# Patient Record
Sex: Male | Born: 1970 | Race: White | Hispanic: No | Marital: Single | Smoking: Former smoker
Health system: Southern US, Community
[De-identification: ages and names within clinical notes are randomized; demographics above are authoritative.]

## PROBLEM LIST (undated history)

## (undated) DIAGNOSIS — G8929 Other chronic pain: Secondary | ICD-10-CM

## (undated) DIAGNOSIS — Z981 Arthrodesis status: Secondary | ICD-10-CM

## (undated) HISTORY — PX: BACK SURGERY: SHX140

---

## 2007-09-17 ENCOUNTER — Emergency Department (HOSPITAL_COMMUNITY): Admission: EM | Admit: 2007-09-17 | Discharge: 2007-09-17 | Payer: Self-pay | Admitting: Emergency Medicine

## 2011-05-08 ENCOUNTER — Encounter (HOSPITAL_COMMUNITY)
Admission: RE | Admit: 2011-05-08 | Discharge: 2011-05-08 | Disposition: A | Discharge status: Home / Self Care | Payer: 59 | Source: Ambulatory Visit | Attending: Neurosurgery | Admitting: Neurosurgery

## 2011-05-08 LAB — SURGICAL PCR SCREEN
MRSA, PCR: NEGATIVE
Staphylococcus aureus: NEGATIVE

## 2011-05-08 LAB — CBC
HCT: 42.1 % (ref 39.0–52.0)
Hemoglobin: 14.6 g/dL (ref 13.0–17.0)
MCH: 30.6 pg (ref 26.0–34.0)
RBC: 4.77 MIL/uL (ref 4.22–5.81)

## 2011-05-08 LAB — DIFFERENTIAL
Basophils Relative: 0 % (ref 0–1)
Lymphocytes Relative: 29 % (ref 12–46)
Monocytes Relative: 12 % (ref 3–12)
Neutro Abs: 4.1 10*3/uL (ref 1.7–7.7)
Neutrophils Relative %: 58 % (ref 43–77)

## 2011-05-09 ENCOUNTER — Other Ambulatory Visit (HOSPITAL_COMMUNITY): Payer: 59

## 2011-05-11 ENCOUNTER — Inpatient Hospital Stay (HOSPITAL_COMMUNITY)
Admission: RE | Admit: 2011-05-11 | Discharge: 2011-05-16 | DRG: 472 | Disposition: A | Discharge status: Home / Self Care | Payer: 59 | Source: Ambulatory Visit | Attending: Neurosurgery | Admitting: Neurosurgery

## 2011-05-11 ENCOUNTER — Inpatient Hospital Stay (HOSPITAL_COMMUNITY): Payer: 59

## 2011-05-11 DIAGNOSIS — M4712 Other spondylosis with myelopathy, cervical region: Principal | ICD-10-CM | POA: Diagnosis present

## 2011-05-11 DIAGNOSIS — M5000 Cervical disc disorder with myelopathy, unspecified cervical region: Secondary | ICD-10-CM | POA: Diagnosis present

## 2011-05-11 DIAGNOSIS — Z01812 Encounter for preprocedural laboratory examination: Secondary | ICD-10-CM

## 2011-05-11 DIAGNOSIS — K219 Gastro-esophageal reflux disease without esophagitis: Secondary | ICD-10-CM | POA: Diagnosis present

## 2011-05-11 HISTORY — DX: Arthrodesis status: Z98.1

## 2011-05-14 ENCOUNTER — Inpatient Hospital Stay (HOSPITAL_COMMUNITY): Payer: 59

## 2011-05-14 ENCOUNTER — Encounter (HOSPITAL_COMMUNITY): Payer: Self-pay

## 2011-05-23 ENCOUNTER — Emergency Department (HOSPITAL_COMMUNITY): Payer: 59

## 2011-05-23 ENCOUNTER — Emergency Department (HOSPITAL_COMMUNITY)
Admission: EM | Admit: 2011-05-23 | Discharge: 2011-05-23 | Disposition: A | Discharge status: Home / Self Care | Payer: 59 | Attending: Emergency Medicine | Admitting: Emergency Medicine

## 2011-05-23 DIAGNOSIS — M542 Cervicalgia: Secondary | ICD-10-CM | POA: Insufficient documentation

## 2011-05-23 DIAGNOSIS — M5412 Radiculopathy, cervical region: Secondary | ICD-10-CM | POA: Insufficient documentation

## 2011-05-23 DIAGNOSIS — M25519 Pain in unspecified shoulder: Secondary | ICD-10-CM | POA: Insufficient documentation

## 2011-05-23 NOTE — Op Note (Addendum)
NAMEDAREAN, ROTE NO.:  [id number]  MEDICAL RECORD NO.:  [id number]  LOCATION:  SDS                          FACILITY:  [facility]  PHYSICIAN:  Hilda Lias, M.D.   DATE OF BIRTH:  1971-06-10  DATE OF PROCEDURE: DATE OF DISCHARGE:  05/08/2011                              OPERATIVE REPORT   PREOPERATIVE DIAGNOSES:  C3-4, C4-5, C5-6, C6-7 cervical stenosis, radiculopathy.  Early myelopathy.  POSTOPERATIVE DIAGNOSIS:  C3-4, C4-5, C5-6, C6-7 cervical stenosis, radiculopathy.  Early myelopathy.  PROCEDURE:  C3-4, C4-5, C5-6, C6-7 diskectomy, spinal cord decompression, foraminotomy, interbody fusion with cages plate, microscope.  SURGEON:  Hilda Lias, MD.  ASSISTANT:  Clydene Fake, M.D.  CLINICAL HISTORY:  The patient was seen in my office for neck pain with radiation to both upper extremities.  The patient has weakness in the biceps and _both_________ deltoids.  MRI showed cervical stenosis from C3 down to C6-C7.  He has failed conservative treatment.  His stenosis goes from 5 to 9 mm anterior-posterior.  Surgery was advised.  The risk was fully explained to him.  PROCEDURE:  The patient was taken to the OR.  After intubation, the neck was prepped with DuraPrep.  Drapes were applied.  Transverse incision through the skin to subcutaneous tissue was done and retraction was carried out until we were able to find the cervical spine.  There were large osteophytes and the first x-ray showed that indeed were at the level of 4-5.  From there, we started going up to the level of C3 and C4 where she has quite a bit of degenerative disk disease.  Total diskectomy was achieved.  This was done with the microscope.  Then, decompression of the spinal cord after we opened the ligament with bilateral foraminotomy was accomplished.  The endplate was trimmed.  At this level, we have set the cages of 6 mm lordotic  with autograft and DBX inside.  At the level of C4-5,  we found mostly spondylosis without ___hnp_______ canal.  Diskectomy was achieved.  The C5 nerve root was quite thick and swollen.  At this level, a cage of 7 mm _cage_________ was inserted.  At C5-6 and C6-7, the patient has a lot of osteophytes. __degenerative________  disk space.  At the level of C5-6, we removed the disk and indeed we found that severe__________ stenosis.  Decompression of spinal cord as well as 6-7 nerve root was achieved.  At this level, the cage of 9 mm was inserted with autograft inside.  The same findings were visualized at the level of C6-7, we found quite a bit of spondylosis.  At this level, a cage of 6 mm was inserted.  Then we tried different plates and at the end we chose a plate which went from C3 down to C7.  Ten screws were used.  Lateral cervical spine showed good position of the plates and screws.  We were able to see C3-4, C4-5, C5-6, and C6-7 well visualized.  From then on the area was irrigated.  A large drain was left in the precervical area and the wound was closed with Vicryl and Steri-Strips.          ______________________________ Lynne Logan  Jeral Fruit, M.D.     EB/MEDQ  D:  05/11/2011  T:  05/11/2011  Job:  [id number]  Electronically Signed by Hilda Lias M.D. on 06/15/2011 06:15:01 PM

## 2011-05-23 NOTE — Discharge Summary (Signed)
  NAMESPYRIDON, HORNSTEIN NO.:  [id number]  MEDICAL RECORD NO.:  [id number]  LOCATION:  3022                         FACILITY:  [facility]  PHYSICIAN:  Hilda Lias, M.D.   DATE OF BIRTH:  1970/10/13  DATE OF ADMISSION:  05/11/2011 DATE OF DISCHARGE:  05/16/2011                              DISCHARGE SUMMARY   ADMISSION DIAGNOSIS:  C3-C4, C4-C5, C5-C6, and C6-C7 stenosis with chronic radiculopathy and early myelopathy.  FINAL DIAGNOSIS:  C3-C4, C4-C5, C5-C6, and C6-C7 stenosis with chronic radiculopathy and early myelopathy.  CLINICAL HISTORY:  The patient is a 40 year old gentleman who was in my office complaining of neck pain radiating to both upper extremities associated with weakness.  The patient had x-ray which showed severe stenosis from C3 down to C6-C7.  Surgery was advised.  Laboratory normal.  COURSE IN THE HOSPITAL:  The patient was taken to Surgery and decompression and fusion was done.  After surgery, the patient developed quite a bit of pain between the shoulder blade.  The patient had a CT scan which was negative.  Today, he is feeling much better.  He is ready to go home.  CONDITION ON DISCHARGE:  Improvement.  MEDICATIONS:  Dilaudid and diazepam.  DIET:  Regular.  ACTIVITY:  Not to drive for at least a week.  FOLLOWUP:  He will be seen by me in 3 weeks or before as needed.          ______________________________ Hilda Lias, M.D.     EB/MEDQ  D:  05/16/2011  T:  05/16/2011  Job:  [id number]  Electronically Signed by Hilda Lias M.D. on 05/23/2011 07:28:36 PM

## 2011-05-31 LAB — DIFFERENTIAL
Eosinophils Relative: 1
Lymphocytes Relative: 17
Lymphs Abs: 1.4
Monocytes Absolute: 0.7

## 2011-05-31 LAB — BASIC METABOLIC PANEL
GFR calc non Af Amer: >60
Glucose, Bld: 86
Potassium: 4.1
Sodium: 138

## 2011-05-31 LAB — CBC
HCT: 46
Hemoglobin: 15.4
WBC: 8.4

## 2011-05-31 LAB — POCT CARDIAC MARKERS: Troponin i, poc: <0.05

## 2011-06-15 NOTE — Consult Note (Signed)
Brandon Gill, Brandon Gill NO.:  [id number]  MEDICAL RECORD NO.:  [id number]  LOCATION:  [facility]                         FACILITY:  [facility]  PHYSICIAN:  Hilda Lias, M.D.   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  The patient underwent surgery about 2 weeks ago.  He has decompression and fusion in the cervical spine to anterior approach.  After surgery the patient had quite a bit of neck pain but eventually he went home the day after Labor Day.  The patient had physical therapy and I got some report of the physical therapy that he was doing really well almost pain free.  It seemed that last night he had a sudden onset of sharp pain in the shoulder in the left side.  He felt that somebody shot him.  He was advised to come to the emergency room and he showed up in the emergency room.  I saw him after he had an MRI.  Right now he is stable.  The complaint is pain in the left shoulder posteriorly.  He denies any weakness.  Clinically, the wound is completely normal although this has a little bit of swelling in the area.  Reflexes are symmetrical 1+.  I cannot find any weakness. Sensation is normal.  He has had tenderness in the scapula on the left side.  While he was in the hospital he had a CT scan of the neck ordered by Dr. Marikay Alar which was quite unremarkable.  The MRI was ordered by the emergency room physician and is quite difficult to read secondary to the patient's movement.  The symptoms appeared to be some fluid collection in the epidural space.  There appears to be some stenosis but difficult to say.  The only thing we know for sure that probably there is some narrowing of the foramen at L5-6 to the left.  I talked to him and his mother at length.  I gave him  several approach.  As I mentioned to both of them the good thing about all of this time he has no evidence of any problems with swallowing.  There is no evidence of hematoma by MRI and there is no weakness whatsoever.  While the approach will be to bring him to the hospital for pain control and during the hospitalization section to proceed with CT myelogram.  The other approach since now the pain is much better is to switch him from Dilaudid to oxycodone and also start using Neurontin 300 mg 3 times a day.  Then tomorrow or Friday to go ahead and have a cervical epidural injection and see how well he does in the next 2 weeks.  If he is not any better in the next 2 weeks or he gets worse while he is at home, then we can do an outpatient CT scan myelogram.  He was fully aware prior to surgery and because of stenosis, there is a possibility that he might require decompression posteriorly.  Since clinically  it is mostly pain and no weakness and there was interval of no pain whatsoever after surgery, my advise on him we might be dealing with some type of radiculitis.  Hopefully the medication plus epidural injection would help.          ______________________________ Hilda Lias, M.D.     EB/MEDQ  D:  05/23/2011  T:  05/24/2011  Job:  [id number]  Electronically Signed by Hilda Lias M.D. on 06/15/2011 06:15:07 PM

## 2011-08-28 ENCOUNTER — Ambulatory Visit (INDEPENDENT_AMBULATORY_CARE_PROVIDER_SITE_OTHER): Payer: 59

## 2011-08-28 DIAGNOSIS — J069 Acute upper respiratory infection, unspecified: Secondary | ICD-10-CM

## 2011-08-28 DIAGNOSIS — J00 Acute nasopharyngitis [common cold]: Secondary | ICD-10-CM

## 2011-08-28 DIAGNOSIS — R059 Cough, unspecified: Secondary | ICD-10-CM

## 2011-08-28 DIAGNOSIS — J029 Acute pharyngitis, unspecified: Secondary | ICD-10-CM

## 2011-08-28 DIAGNOSIS — R05 Cough: Secondary | ICD-10-CM

## 2012-01-17 ENCOUNTER — Ambulatory Visit (INDEPENDENT_AMBULATORY_CARE_PROVIDER_SITE_OTHER): Payer: 59 | Admitting: Emergency Medicine

## 2012-01-17 VITALS — BP 123/77 | HR 59 | Temp 97.7°F | Resp 16 | Ht 67.0 in | Wt 181.6 lb

## 2012-01-17 DIAGNOSIS — N509 Disorder of male genital organs, unspecified: Secondary | ICD-10-CM

## 2012-01-17 DIAGNOSIS — R300 Dysuria: Secondary | ICD-10-CM

## 2012-01-17 DIAGNOSIS — N50819 Testicular pain, unspecified: Secondary | ICD-10-CM

## 2012-01-17 LAB — POCT URINALYSIS DIPSTICK
Bilirubin, UA: NEGATIVE
Glucose, UA: NEGATIVE
Leukocytes, UA: NEGATIVE
Nitrite, UA: NEGATIVE

## 2012-01-17 LAB — POCT UA - MICROSCOPIC ONLY
Bacteria, U Microscopic: NEGATIVE
Casts, Ur, LPF, POC: NEGATIVE
RBC, urine, microscopic: NEGATIVE

## 2012-01-17 MED ORDER — DOXYCYCLINE HYCLATE 100 MG PO TABS: 100.0000 mg | ORAL_TABLET | Freq: Two times a day (BID) | ORAL | Status: AC

## 2012-01-17 NOTE — Progress Notes (Signed)
  Subjective:    Patient ID: Brandon Gill, male    DOB: 02-25-1971, 41 y.o.   MRN: [id number]  HPI patient presents with onset yesterday of difficulty with urination. He has had pain in both of his testicles but mainly in the left testicle. He has had some straining to urinate he definitely has burning at the end of urination he denies any STD exposures. He has had no new sexual contacts. He denies having a discharge.    Review of Systems is currently having severe problems with his back. His radicular symptoms down his left leg the tendon care Dr. Jeral Fruit and there is a possibility he will need to undergo surgery.     Objective:   Physical Exam physical exam of the abdomen reveals the abdomen to be soft there is no tenderness to the no masses palpable. Examination of the genitals reveals the penis to be normal.  the meatus. Has no discharge  Rectal exam confirms a normal size prostate which is nontender. The right testicle is normal the left testicle is normal with tenderness along the left epididymis the  Results for orders placed in visit on 01/17/12  POCT URINALYSIS DIPSTICK      Component Value Range   Color, UA yellow     Clarity, UA clear     Glucose, UA neg     Bilirubin, UA neg     Ketones, UA neg     Spec Grav, UA 1.015     Blood, UA neg     pH, UA 7.5     Protein, UA neg     Urobilinogen, UA 0.2     Nitrite, UA neg     Leukocytes, UA Negative    POCT UA - MICROSCOPIC ONLY      Component Value Range   WBC, Ur, HPF, POC neg     RBC, urine, microscopic neg     Bacteria, U Microscopic neg     Mucus, UA neg     Epithelial cells, urine per micros 0-1     Crystals, Ur, HPF, POC neg     Casts, Ur, LPF, POC neg     Yeast, UA neg           Assessment & Plan:  I suspect patient is in acute epididymitis. We'll treat with doxycycline. Gen-Probe was done urine was done for

## 2012-01-18 LAB — GC/CHLAMYDIA PROBE AMP, GENITAL: GC Probe Amp, Genital: NEGATIVE

## 2012-06-24 DIAGNOSIS — G8929 Other chronic pain: Secondary | ICD-10-CM | POA: Insufficient documentation

## 2012-06-24 DIAGNOSIS — M545 Low back pain, unspecified: Secondary | ICD-10-CM | POA: Insufficient documentation

## 2012-07-14 ENCOUNTER — Ambulatory Visit (INDEPENDENT_AMBULATORY_CARE_PROVIDER_SITE_OTHER): Payer: 59 | Admitting: Family Medicine

## 2012-07-14 VITALS — BP 140/76 | HR 82 | Temp 98.1°F | Resp 18 | Ht 69.0 in | Wt 188.4 lb

## 2012-07-14 DIAGNOSIS — M5432 Sciatica, left side: Secondary | ICD-10-CM

## 2012-07-14 DIAGNOSIS — M5430 Sciatica, unspecified side: Secondary | ICD-10-CM

## 2012-07-14 MED ORDER — HYDROCODONE-ACETAMINOPHEN 5-500 MG PO TABS: 1.0000 | ORAL_TABLET | Freq: Three times a day (TID) | ORAL | Status: DC | PRN

## 2012-07-14 NOTE — Progress Notes (Signed)
This is a 41 y.o.male who has had chronic back pain for over a year.  He has seen Dr. Jeral Fruit who dx'd two ruptured discs, and patient has f/u appt Dec 5.  Now patient has pain radiating down left leg.   Patient has a past history of low back pain for which prednisone hasn't worked.  Brandon Gill denies any urinary symptoms, bowel problems, numbness in the legs, loss of motor power. Brandon Gill had no fever.  Brandon Gill has tried Lyrica and Relafen.  Past Medical History  Diagnosis Date  . S/P cervical spinal fusion      No past surgical history on file.  Objective:  middle-aged male in no acute distress. Blood pressure 140/76, pulse 82, temperature 98.1 F (36.7 C), temperature source Oral, resp. rate 18, Gill 5\' 9"  (1.753 m), weight 188 lb 6.4 oz (85.458 kg), SpO2 99.00%.Body mass index is 27.82 kg/(m^2). Palpation of the back reveals some tenderness upper left lumbar area   Inspection of the back: Reveals no scoliosis  Straight-leg raising: positive only on left  Motor exam of lower extremity: No abnormal weakness.  Reflexes: Symmetric and decreased AJ bilaterally  Skin exam: no rash  Assessment/Plan: Acute lower back pain without acute neurological findings.

## 2012-07-14 NOTE — Patient Instructions (Addendum)
Sciatica Sciatica is pain, weakness, numbness, or tingling along the path of the sciatic nerve. The nerve starts in the lower back and runs down the back of each leg. The nerve controls the muscles in the lower leg and in the back of the knee, while also providing sensation to the back of the thigh, lower leg, and the sole of your foot. Sciatica is a symptom of another medical condition. For instance, nerve damage or certain conditions, such as a herniated disk or bone spur on the spine, pinch or put pressure on the sciatic nerve. This causes the pain, weakness, or other sensations normally associated with sciatica. Generally, sciatica only affects one side of the body. CAUSES   Herniated or slipped disc.  Degenerative disk disease.  A pain disorder involving the narrow muscle in the buttocks (piriformis syndrome).  Pelvic injury or fracture.  Pregnancy.  Tumor (rare). SYMPTOMS  Symptoms can vary from mild to very severe. The symptoms usually travel from the low back to the buttocks and down the back of the leg. Symptoms can include:  Mild tingling or dull aches in the lower back, leg, or hip.  Numbness in the back of the calf or sole of the foot.  Burning sensations in the lower back, leg, or hip.  Sharp pains in the lower back, leg, or hip.  Leg weakness.  Severe back pain inhibiting movement. These symptoms may get worse with coughing, sneezing, laughing, or prolonged sitting or standing. Also, being overweight may worsen symptoms. DIAGNOSIS  Your caregiver will perform a physical exam to look for common symptoms of sciatica. He or she may ask you to do certain movements or activities that would trigger sciatic nerve pain. Other tests may be performed to find the cause of the sciatica. These may include:  Blood tests.  X-rays.  Imaging tests, such as an MRI or CT scan. TREATMENT  Treatment is directed at the cause of the sciatic pain. Sometimes, treatment is not necessary  and the pain and discomfort goes away on its own. If treatment is needed, your caregiver may suggest:  Over-the-counter medicines to relieve pain.  Prescription medicines, such as anti-inflammatory medicine, muscle relaxants, or narcotics.  Applying heat or ice to the painful area.  Steroid injections to lessen pain, irritation, and inflammation around the nerve.  Reducing activity during periods of pain.  Exercising and stretching to strengthen your abdomen and improve flexibility of your spine. Your caregiver may suggest losing weight if the extra weight makes the back pain worse.  Physical therapy.  Surgery to eliminate what is pressing or pinching the nerve, such as a bone spur or part of a herniated disk. HOME CARE INSTRUCTIONS   Only take over-the-counter or prescription medicines for pain or discomfort as directed by your caregiver.  Apply ice to the affected area for 20 minutes, 3 4 times a day for the first 48 72 hours. Then try heat in the same way.  Exercise, stretch, or perform your usual activities if these do not aggravate your pain.  Attend physical therapy sessions as directed by your caregiver.  Keep all follow-up appointments as directed by your caregiver.  Do not wear high heels or shoes that do not provide proper support.  Check your mattress to see if it is too soft. A firm mattress may lessen your pain and discomfort. SEEK IMMEDIATE MEDICAL CARE IF:   You lose control of your bowel or bladder (incontinence).  You have increasing weakness in the lower back,   pelvis, buttocks, or legs.  You have redness or swelling of your back.  You have a burning sensation when you urinate.  You have pain that gets worse when you lie down or awakens you at night.  Your pain is worse than you have experienced in the past.  Your pain is lasting longer than 4 weeks.  You are suddenly losing weight without reason. MAKE SURE YOU:  Understand these  instructions.  Will watch your condition.  Will get help right away if you are not doing well or get worse. Document Released: 08/21/2001 Document Revised: 02/26/2012 Document Reviewed: 01/06/2012 [employer] Patient Information 2013 [employer].  

## 2012-07-19 ENCOUNTER — Ambulatory Visit
Admission: RE | Admit: 2012-07-19 | Discharge: 2012-07-19 | Disposition: A | Discharge status: Home / Self Care | Payer: 59 | Source: Ambulatory Visit | Attending: Family Medicine | Admitting: Family Medicine

## 2012-07-19 DIAGNOSIS — M5432 Sciatica, left side: Secondary | ICD-10-CM

## 2014-01-15 ENCOUNTER — Ambulatory Visit (INDEPENDENT_AMBULATORY_CARE_PROVIDER_SITE_OTHER): Payer: 59 | Admitting: Physician Assistant

## 2014-01-15 VITALS — BP 126/76 | HR 75 | Temp 98.3°F | Resp 16 | Ht 68.0 in | Wt 187.8 lb

## 2014-01-15 DIAGNOSIS — J029 Acute pharyngitis, unspecified: Secondary | ICD-10-CM

## 2014-01-15 DIAGNOSIS — H9209 Otalgia, unspecified ear: Secondary | ICD-10-CM

## 2014-01-15 LAB — POCT RAPID STREP A (OFFICE): Rapid Strep A Screen: NEGATIVE

## 2014-01-15 MED ORDER — FIRST-DUKES MOUTHWASH MT SUSP: 5.0000 mL | OROMUCOSAL | Status: AC | PRN

## 2014-01-15 MED ORDER — AMOXICILLIN 875 MG PO TABS: 875.0000 mg | ORAL_TABLET | Freq: Two times a day (BID) | ORAL | Status: DC

## 2014-01-15 NOTE — Progress Notes (Signed)
   Subjective:    Patient ID: Brandon Gill, male    DOB: 07/23/1971, 43 y.o.   MRN: [id number]  HPI 43 year old male presents for evaluation of sore throat. Symptoms started 3 days ago and are significantly worse today.  Admits to right sided ear pain and some sinus pressure. Has had very little congestion and no cough.  Has a history of strep throat but it has been "a while" since his last infection.  Denies fever, chills, headache or dizziness.  Had some diarrhea and nausea at onset of illness but that has resolved.  He is otherwise doing well with no other concerns today.     Review of Systems  Constitutional: Negative for fever and chills.  HENT: Positive for ear pain, sinus pressure and sore throat. Negative for congestion and trouble swallowing.   Respiratory: Negative for cough.   Gastrointestinal: Negative for nausea, vomiting, abdominal pain and diarrhea.  Neurological: Negative for dizziness and headaches.       Objective:   Physical Exam  Constitutional: He is oriented to person, place, and time. He appears well-developed and well-nourished.  HENT:  Head: Normocephalic and atraumatic.  Right Ear: Hearing, tympanic membrane, external ear and ear canal normal.  Left Ear: Hearing, tympanic membrane, external ear and ear canal normal.  Mouth/Throat: Uvula is midline and mucous membranes are normal. Posterior oropharyngeal erythema present. No oropharyngeal exudate, posterior oropharyngeal edema or tonsillar abscesses.  Eyes: Conjunctivae are normal.  Neck: Normal range of motion. Neck supple.  Cardiovascular: Normal rate, regular rhythm and normal heart sounds.   Pulmonary/Chest: Effort normal and breath sounds normal.  Lymphadenopathy:    He has no cervical adenopathy.  Neurological: He is alert and oriented to person, place, and time.  Psychiatric: He has a normal mood and affect. His behavior is normal. Judgment and thought content normal.    Results for orders  placed in visit on 01/15/14  POCT RAPID STREP A (OFFICE)      Result Value Ref Range   Rapid Strep A Screen Negative  Negative         Assessment & Plan:  Acute pharyngitis - Plan: POCT rapid strep A  Otalgia  Likely viral pharyngitis. Will treat conservatively with [employer]'s mouthwash q2-3 hours prn pain Ibuprofen 600-800 mg tid with food Throat culture pending If no improvement in 48 hours, may fill rx for amoxicillin RTC precautions discussed Follow up if symptoms worsening or fail to improve.

## 2014-01-17 LAB — CULTURE, GROUP A STREP: Organism ID, Bacteria: NORMAL

## 2014-06-16 ENCOUNTER — Other Ambulatory Visit: Payer: Self-pay | Admitting: Neurosurgery

## 2014-06-16 DIAGNOSIS — M5412 Radiculopathy, cervical region: Secondary | ICD-10-CM

## 2014-06-23 ENCOUNTER — Other Ambulatory Visit: Payer: 59

## 2014-06-24 ENCOUNTER — Ambulatory Visit
Admission: RE | Admit: 2014-06-24 | Discharge: 2014-06-24 | Disposition: A | Discharge status: Home / Self Care | Payer: 59 | Source: Ambulatory Visit | Attending: Neurosurgery | Admitting: Neurosurgery

## 2014-06-24 ENCOUNTER — Other Ambulatory Visit: Payer: Self-pay | Admitting: Radiology

## 2014-06-24 ENCOUNTER — Encounter: Payer: Self-pay | Admitting: Radiology

## 2014-06-24 VITALS — BP 136/67 | HR 68

## 2014-06-24 DIAGNOSIS — M545 Low back pain: Secondary | ICD-10-CM

## 2014-06-24 DIAGNOSIS — M5412 Radiculopathy, cervical region: Secondary | ICD-10-CM

## 2014-06-24 DIAGNOSIS — G8929 Other chronic pain: Secondary | ICD-10-CM

## 2014-06-24 MED ORDER — DIAZEPAM 5 MG PO TABS
10.0000 mg | ORAL_TABLET | Freq: Once | ORAL | Status: AC
  Administered 2014-06-24: 10 mg via ORAL

## 2014-06-24 MED ORDER — ONDANSETRON HCL 4 MG/2ML IJ SOLN
4.0000 mg | Freq: Once | INTRAMUSCULAR | Status: AC
  Administered 2014-06-24: 4 mg via INTRAMUSCULAR

## 2014-06-24 MED ORDER — MEPERIDINE HCL 100 MG/ML IJ SOLN
100.0000 mg | Freq: Once | INTRAMUSCULAR | Status: AC
  Administered 2014-06-24: 100 mg via INTRAMUSCULAR

## 2014-06-24 MED ORDER — IOHEXOL 300 MG/ML  SOLN
10.0000 mL | Freq: Once | INTRAMUSCULAR | Status: AC | PRN
  Administered 2014-06-24: 10 mL via INTRATHECAL

## 2014-06-24 NOTE — Discharge Instructions (Signed)

## 2017-12-10 ENCOUNTER — Encounter (HOSPITAL_COMMUNITY): Payer: Self-pay

## 2017-12-10 ENCOUNTER — Emergency Department (HOSPITAL_COMMUNITY): Payer: 59

## 2017-12-10 ENCOUNTER — Emergency Department (HOSPITAL_COMMUNITY)
Admission: EM | Admit: 2017-12-10 | Discharge: 2017-12-10 | Disposition: A | Discharge status: Home / Self Care | Payer: 59 | Attending: Emergency Medicine | Admitting: Emergency Medicine

## 2017-12-10 DIAGNOSIS — G8929 Other chronic pain: Secondary | ICD-10-CM | POA: Insufficient documentation

## 2017-12-10 DIAGNOSIS — Z87891 Personal history of nicotine dependence: Secondary | ICD-10-CM | POA: Insufficient documentation

## 2017-12-10 DIAGNOSIS — Z79899 Other long term (current) drug therapy: Secondary | ICD-10-CM | POA: Diagnosis not present

## 2017-12-10 DIAGNOSIS — Y92003 Bedroom of unspecified non-institutional (private) residence as the place of occurrence of the external cause: Secondary | ICD-10-CM | POA: Insufficient documentation

## 2017-12-10 DIAGNOSIS — Y9389 Activity, other specified: Secondary | ICD-10-CM | POA: Diagnosis not present

## 2017-12-10 DIAGNOSIS — Y999 Unspecified external cause status: Secondary | ICD-10-CM | POA: Diagnosis not present

## 2017-12-10 DIAGNOSIS — M25551 Pain in right hip: Secondary | ICD-10-CM | POA: Insufficient documentation

## 2017-12-10 DIAGNOSIS — W01198A Fall on same level from slipping, tripping and stumbling with subsequent striking against other object, initial encounter: Secondary | ICD-10-CM | POA: Diagnosis not present

## 2017-12-10 DIAGNOSIS — M5441 Lumbago with sciatica, right side: Secondary | ICD-10-CM | POA: Insufficient documentation

## 2017-12-10 DIAGNOSIS — M545 Low back pain: Secondary | ICD-10-CM | POA: Diagnosis present

## 2017-12-10 LAB — BASIC METABOLIC PANEL
ANION GAP: 9 (ref 5–15)
BUN: 18 mg/dL (ref 6–20)
CALCIUM: 8.8 mg/dL — AB (ref 8.9–10.3)
CO2: 24 mmol/L (ref 22–32)
Chloride: 106 mmol/L (ref 101–111)
Creatinine, Ser: 0.89 mg/dL (ref 0.61–1.24)
GFR calc Af Amer: >60 mL/min (ref >60)
GFR calc non Af Amer: >60 mL/min (ref >60)
GLUCOSE: 95 mg/dL (ref 65–99)
Potassium: 3.7 mmol/L (ref 3.5–5.1)
Sodium: 139 mmol/L (ref 135–145)

## 2017-12-10 LAB — CBC
HCT: 39.8 % (ref 39.0–52.0)
Hemoglobin: 12.9 g/dL — ABNORMAL LOW (ref 13.0–17.0)
MCH: 29.9 pg (ref 26.0–34.0)
MCHC: 32.4 g/dL (ref 30.0–36.0)
MCV: 92.3 fL (ref 78.0–100.0)
Platelets: 265 10*3/uL (ref 150–400)
RBC: 4.31 MIL/uL (ref 4.22–5.81)
RDW: 14.4 % (ref 11.5–15.5)
WBC: 8.4 10*3/uL (ref 4.0–10.5)

## 2017-12-10 MED ORDER — DEXAMETHASONE SODIUM PHOSPHATE 10 MG/ML IJ SOLN
10.0000 mg | Freq: Once | INTRAMUSCULAR | Status: AC
  Administered 2017-12-10: 10 mg via INTRAVENOUS
  Filled 2017-12-10: qty 1

## 2017-12-10 MED ORDER — METHOCARBAMOL 1000 MG/10ML IJ SOLN
1000.0000 mg | Freq: Once | INTRAVENOUS | Status: AC
  Administered 2017-12-10: 1000 mg via INTRAVENOUS
  Filled 2017-12-10: qty 10

## 2017-12-10 MED ORDER — HYDROMORPHONE HCL 1 MG/ML IJ SOLN
1.0000 mg | Freq: Once | INTRAMUSCULAR | Status: AC
  Administered 2017-12-10: 1 mg via INTRAVENOUS
  Filled 2017-12-10: qty 1

## 2017-12-10 MED ORDER — HYDROMORPHONE HCL 1 MG/ML IJ SOLN
0.5000 mg | Freq: Once | INTRAMUSCULAR | Status: AC
  Administered 2017-12-10: 0.5 mg via INTRAVENOUS
  Filled 2017-12-10: qty 1

## 2017-12-10 MED ORDER — METHOCARBAMOL 1000 MG/10ML IJ SOLN: 1000.0000 mg | Freq: Once | INTRAMUSCULAR | Status: DC

## 2017-12-10 MED ORDER — KETOROLAC TROMETHAMINE 30 MG/ML IJ SOLN
30.0000 mg | Freq: Once | INTRAMUSCULAR | Status: AC
  Administered 2017-12-10: 30 mg via INTRAVENOUS
  Filled 2017-12-10: qty 1

## 2017-12-10 NOTE — ED Notes (Signed)
[facility]

## 2017-12-10 NOTE — ED Notes (Signed)
Pt ambulated in hallway with IV pole as an aid without difficulty. Pt reports minimal pain and feeling a lot better.

## 2017-12-10 NOTE — ED Triage Notes (Addendum)
Pt cc of back pain, stated the pain cause him to fall. Denies hitting head. Sx right sided pain that radiates to right thigh. Hx of sciatic nerve pain and degenerative discs. Pt has prescriptions with muscle relaxant meds but is not complaint.

## 2017-12-10 NOTE — Discharge Instructions (Addendum)
1. Medications: usual home medications 2. Treatment: rest, drink plenty of fluids, gentle stretching, alternate ice and heat 3. Follow Up: Please followup with your primary doctor in 2-3 days for discussion of your diagnoses and further evaluation after today's visit; if you do not have a primary care doctor use the resource guide provided to find one;  Return to the ER for worsening back pain, difficulty walking, loss of bowel or bladder control or other concerning symptoms

## 2017-12-10 NOTE — ED Notes (Signed)
Pt given water for fluid challenge. Pt tolerating well. Will continue to monitor.   

## 2017-12-10 NOTE — ED Notes (Signed)
Info put in on incorrect patient; this patient NOT IVCed.

## 2017-12-10 NOTE — ED Provider Notes (Signed)
[facility] EMERGENCY DEPARTMENT Provider Note   CSN: [id number] Arrival date & time: 12/10/17  0216     History   Chief Complaint Chief Complaint  Patient presents with  . Back Pain  . Fall    HPI Brandon Gill is a 47 y.o. male with a hx of chronic back pain (taking vicodin), cervical fusion presents to the Emergency Department complaining of gradual, persistent, progressively worsening right-sided low back pain onset several days ago but severely worsening tonight.  Patient reports that he has been struggling with increasing pain for several hours prior to arrival.  He reports he attempted to go to bed but this made the pain worse.  He reports he stood up to get out of bed in his right leg "buckled" and he fell striking his right hip on the floor.  Patient reports he did not hit his head or have a loss of consciousness.  Patient reports previous cervical spine fusion.  He also reports he had an MRI done several days ago of his low back.  Additionally, patient is enrolled in a pain clinic.  He reports taking Vicodin and some muscle relaxer as prescribed by the pain clinic.  He did not take either of these tonight.  Patient denies previous falls, additional known trauma, recent sick contacts, fever, chills, IV drug use, anticoagulant.  Patient reports taking Advil tonight without relief.  Nothing seems to make it better or worse.   The history is provided by the patient and medical records. No language interpreter was used.    Past Medical History:  Diagnosis Date  . S/P cervical spinal fusion     Patient Active Problem List   Diagnosis Date Noted  . Chronic LBP 06/24/2012    History reviewed. No pertinent surgical history.      Home Medications    Prior to Admission medications   Medication Sig Start Date End Date Taking? Authorizing Provider  amoxicillin (AMOXIL) 875 MG tablet Take 1 tablet (875 mg total) by mouth 2 (two) times daily. 01/15/14    Nelva Nay, PA-C  Cholecalciferol (VITAMIN D) 1000 UNITS capsule Take 1,000 Units by mouth.    [provider]  Coenzyme Q10 (CO Q-10) 300 MG CAPS Take 300 mg by mouth.    [provider]  diclofenac (VOLTAREN) 75 MG EC tablet Take 75 mg by mouth 2 (two) times daily.    [provider]  Diphenhyd-Hydrocort-Nystatin (FIRST-DUKES MOUTHWASH) SUSP Use as directed 5-10 mLs in the mouth or throat every 2 (two) hours as needed. Use 1:1 ratio with viscous lidocaine 01/15/14   Marte, Heather M, PA-C  Flaxseed, Linseed, (FLAX SEED OIL) 1000 MG CAPS Take by mouth.    [provider]  Folic Acid 5 MG CAPS Take by mouth.    [provider]  HYDROcodone-acetaminophen (VICODIN) 5-500 MG per tablet Take 1 tablet by mouth every 8 (eight) hours as needed for pain. 07/14/12   Elvina Sidle, MD  Magnesium 500 MG CAPS Take by mouth.    [provider]  Multiple Vitamin (THERA) TABS Take 1 tablet by mouth.    [provider]  nabumetone (RELAFEN) 750 MG tablet Take 750 mg by mouth 2 (two) times daily.    [provider]  Omega-3 Fatty Acids (FISH OIL) 1000 MG CAPS Take 2 g by mouth.    [provider]  pregabalin (LYRICA) 150 MG capsule Take 150 mg by mouth 2 (two) times daily.  [provider]  vitamin B-12 (CYANOCOBALAMIN) 100 MCG tablet Take 50 mcg by mouth.    [provider]    Family History No family history on file.  Social History Social History   Tobacco Use  . Smoking status: Former Smoker    Last attempt to quit: 01/16/2009    Years since quitting: 8.9  Substance Use Topics  . Alcohol use: Not on file  . Drug use: Not on file     Allergies   Patient has no known allergies.   Review of Systems Review of Systems  Constitutional: Negative for appetite change, diaphoresis, fatigue, fever and unexpected weight change.  HENT: Negative for mouth sores.   Eyes: Negative for visual  disturbance.  Respiratory: Negative for cough, chest tightness, shortness of breath and wheezing.   Cardiovascular: Negative for chest pain.  Gastrointestinal: Negative for abdominal pain, constipation, diarrhea, nausea and vomiting.  Endocrine: Negative for polydipsia, polyphagia and polyuria.  Genitourinary: Negative for dysuria, frequency, hematuria and urgency.  Musculoskeletal: Positive for arthralgias, back pain, gait problem and myalgias. Negative for neck stiffness.  Skin: Negative for rash.  Allergic/Immunologic: Negative for immunocompromised state.  Neurological: Negative for syncope, light-headedness and headaches.  Hematological: Does not bruise/bleed easily.  Psychiatric/Behavioral: Negative for sleep disturbance. The patient is not nervous/anxious.      Physical Exam Updated Vital Signs BP (!) 151/120 (BP Location: Left Arm)   Pulse 72   Temp 99 F (37.2 C) (Oral)   Resp (!) 34   SpO2 99%   Physical Exam  Constitutional: He appears well-developed and well-nourished. He appears distressed.  Awake, alert, nontoxic appearance Tearful.  HENT:  Head: Normocephalic and atraumatic.  Mouth/Throat: Oropharynx is clear and moist. No oropharyngeal exudate.  Eyes: Conjunctivae are normal. No scleral icterus.  Neck: Normal range of motion. Neck supple.  Full ROM without pain  Cardiovascular: Normal rate, regular rhythm and intact distal pulses.  Pulmonary/Chest: Effort normal and breath sounds normal. No respiratory distress. He has no wheezes.  Equal chest expansion  Abdominal: Soft. Bowel sounds are normal. He exhibits no distension and no mass. There is no tenderness. There is no rebound and no guarding.  Genitourinary:  Genitourinary Comments: No saddle anesthesia  Musculoskeletal: He exhibits no edema.       Right hip: He exhibits decreased range of motion and tenderness.       Right knee: He exhibits decreased range of motion.       Right ankle: He exhibits  decreased range of motion.       Cervical back: He exhibits decreased range of motion.       Thoracic back: He exhibits decreased range of motion.       Lumbar back: He exhibits decreased range of motion, pain and spasm.       Back:  No midline tenderness to the T-spine or L-spine.  Tenderness to palpation along the right paraspinal muscles and right SI joint. Full range of motion of the left hip, knee, ankle and toes.  Full range of motion of the right toes but significantly limited range of motion of the right hip, knee and ankle due to severe pain.  No peripheral edema of the bilateral lower extremities. Tenderness to palpation over the right greater trochanter.  No ecchymosis noted or obvious deformity.  No rotation or shortening of the right leg.  Lymphadenopathy:    He has no cervical adenopathy.  Neurological: He is alert.  Speech is clear and goal oriented,  follows commands Normal 5/5 strength in upper extremities bilaterally including strong and equal grip strength Strength 5/5 in the left lower extremity including flexion/extension of the hip, knee and ankle Strength 3/5 in the right lower extremity at the ankle and toes.  Patient refuses to test right knee or hip. Sensation intact to light touch lower extremities Moves extremities without ataxia, coordination intact Gait testing deferred No Clonus  Skin: Skin is warm and dry. No rash noted. He is not diaphoretic. No erythema.  Psychiatric: He has a normal mood and affect. His behavior is normal.  Nursing note and vitals reviewed.    ED Treatments / Results  Labs (all labs ordered are listed, but only abnormal results are displayed) Labs Reviewed  CBC - Abnormal; Notable for the following components:      Result Value   Hemoglobin 12.9 (*)    All other components within normal limits  BASIC METABOLIC PANEL - Abnormal; Notable for the following components:   Calcium 8.8 (*)    All other components within normal limits     Radiology Dg Lumbar Spine Complete  Result Date: 12/10/2017 CLINICAL DATA:  Back pain resulting in fall.  History of sciatica. EXAM: LUMBAR SPINE - COMPLETE 4+ VIEW COMPARISON:  MRI lumbar spine December 05, 2017 FINDINGS: Five non rib-bearing lumbar-type vertebral bodies are intact and aligned with straightened lumbar lordosis. Intervertebral disc heights are normal. No destructive bony lesions. Multilevel Schmorl's nodes. Sacroiliac joints are symmetric. Included prevertebral and paraspinal soft tissue planes are non-suspicious. Phleboliths project in RIGHT pelvis. IMPRESSION: 1. No fracture deformity or malalignment. 2. Degenerative change of the lumbar spine better demonstrated on recent MRI. Electronically Signed   By: Awilda Metro M.D.   On: 12/10/2017 03:55   Dg Hip Unilat W Or Wo Pelvis 2-3 Views Right  Result Date: 12/10/2017 CLINICAL DATA:  Low back pain, fall.  History of sciatica. EXAM: DG HIP (WITH OR WITHOUT PELVIS) 2-3V RIGHT COMPARISON:  None. FINDINGS: There is no evidence of hip fracture or dislocation. There is no evidence of arthropathy or other focal bone abnormality. Phleboliths RIGHT pelvis. Scrotoliths. IMPRESSION: Negative. Electronically Signed   By: Awilda Metro M.D.   On: 12/10/2017 03:56    Procedures Procedures (including critical care time)  Medications Ordered in ED Medications  HYDROmorphone (DILAUDID) injection 1 mg (1 mg Intravenous Given 12/10/17 0336)  dexamethasone (DECADRON) injection 10 mg (10 mg Intravenous Given 12/10/17 0336)  ketorolac (TORADOL) 30 MG/ML injection 30 mg (30 mg Intravenous Given 12/10/17 0336)  methocarbamol (ROBAXIN) 1,000 mg in dextrose 5 % 50 mL IVPB (0 mg Intravenous Stopped 12/10/17 0427)  HYDROmorphone (DILAUDID) injection 0.5 mg (0.5 mg Intravenous Given 12/10/17 0447)     Initial Impression / Assessment and Plan / ED Course  I have reviewed the triage vital signs and the nursing notes.  Pertinent labs & imaging results  that were available during my care of the patient were reviewed by me and considered in my medical decision making (see chart for details).  Clinical Course as of Dec 10 600  Tue Dec 10, 2017  0438 Pt with improved pain.   [HM]  0545 Pt is able to walk with steady gait in the ED   [HM]  0545 Improved BP with pain control  BP(!): 128/91 [HM]    Clinical Course User Index [HM] Martese Vanatta, Dahlia Client, PA-C    Patient reports acute exacerbation of chronic pain.  On initial exam, patient with poor effort and unable to move  right leg.  After pain medication, patient was significantly improved neurologic exam.  Sensation intact, normal strength rotation and normal gait.  No evidence of urinary incontinence or retention, pain is consistently reproducible.  No saddle anesthesia.  There is no evidence of AAA or concern for dissection at this time.   Patient can walk but states is painful.  No loss of bowel or bladder control.  No concern for cauda equina.  No fever, night sweats, weight loss, h/o cancer, IVDU.  Pain treated here in the department with adequate improvement. RICE protocol and pain medicine indicated and discussed with patient. I have also discussed reasons to return immediately to the ER.  Patient expresses understanding and agrees with plan.    Final Clinical Impressions(s) / ED Diagnoses   Final diagnoses:  Acute right-sided low back pain with right-sided sciatica  Chronic right-sided low back pain with right-sided sciatica    ED Discharge Orders    None       Milta Deiters 12/10/17 0981    Palumbo, April, MD 12/10/17 [phone]

## 2019-03-06 ENCOUNTER — Other Ambulatory Visit: Payer: Self-pay | Admitting: Anesthesiology

## 2019-03-06 DIAGNOSIS — M488X2 Other specified spondylopathies, cervical region: Secondary | ICD-10-CM

## 2019-03-31 ENCOUNTER — Other Ambulatory Visit: Payer: Self-pay

## 2019-03-31 ENCOUNTER — Ambulatory Visit
Admission: RE | Admit: 2019-03-31 | Discharge: 2019-03-31 | Disposition: A | Discharge status: Home / Self Care | Payer: 59 | Source: Ambulatory Visit | Attending: Anesthesiology | Admitting: Anesthesiology

## 2019-03-31 DIAGNOSIS — M488X2 Other specified spondylopathies, cervical region: Secondary | ICD-10-CM

## 2020-11-10 IMAGING — CT CT CERVICAL SPINE WITHOUT CONTRAST
2 series · 10 of 14 positions shown, 12 images · non-contrast
Comparison: CT 06/24/2014

CLINICAL DATA: Numbness and tingling of both upper extremities over
the last 4-5 months. Previous cervical fusion.

EXAM:
CT CERVICAL SPINE WITHOUT CONTRAST
TECHNIQUE: Multidetector CT imaging of the cervical spine was performed without
intravenous contrast. Multiplanar CT image reconstructions were also
generated.

[Series 3: cspine soft · axial · 0.29mm/px · z∈[-242,-122]mm · 5 of 90 slices shown]
[im 15/90  soft-tissue]
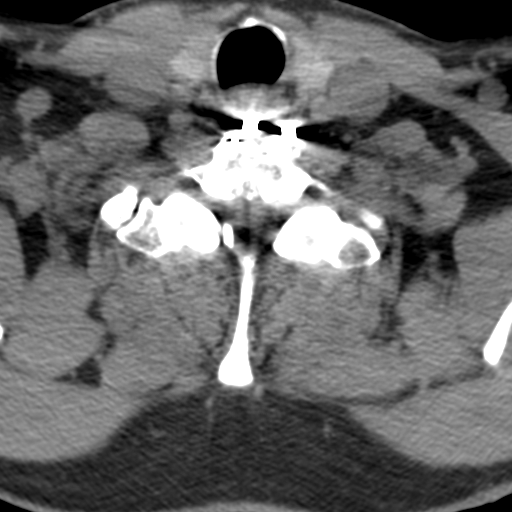
[im 30/90  soft-tissue]
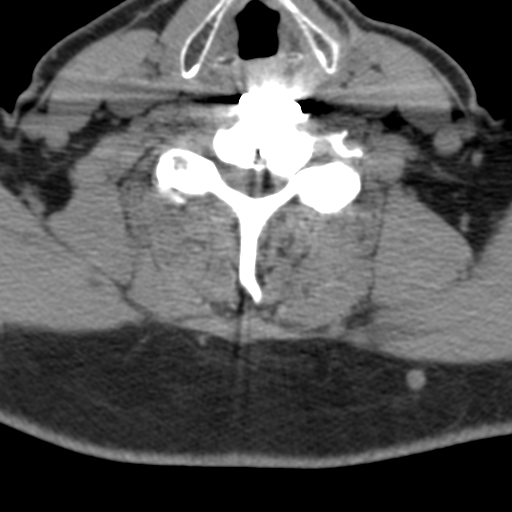
[im 45/90  soft-tissue]
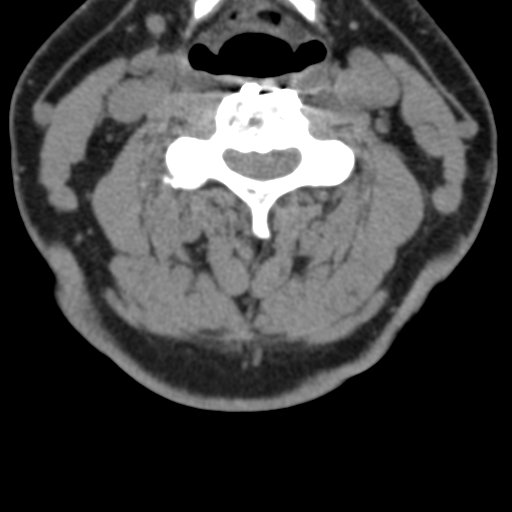
[im 60/90  soft-tissue]
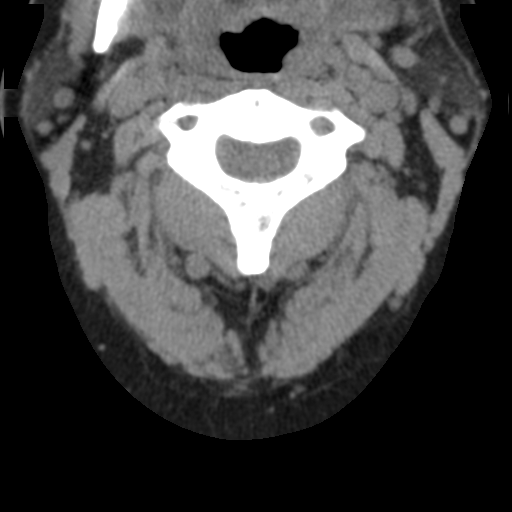
[im 75/90  soft-tissue]
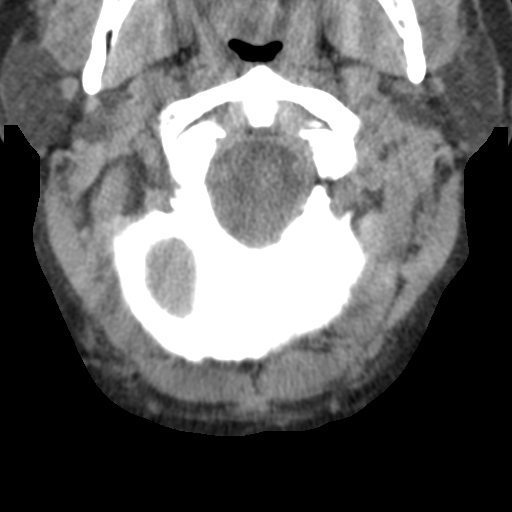

[Series 9: angled axial · axial · 0.29mm/px · z∈[-246,-127]mm · 5 of 90 slices shown, 7 images]
[im 15/90  soft-tissue]
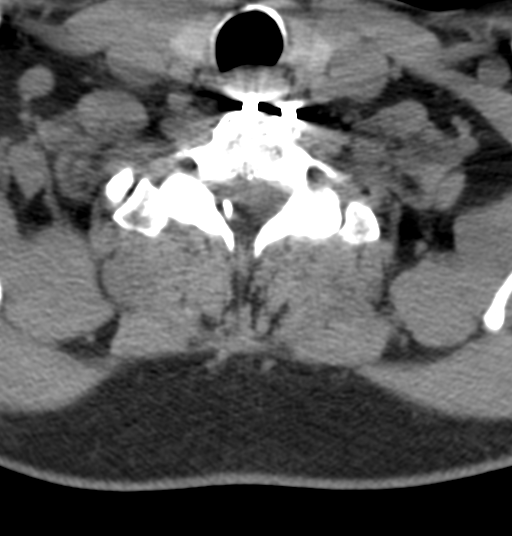
[im 15/90  bone]
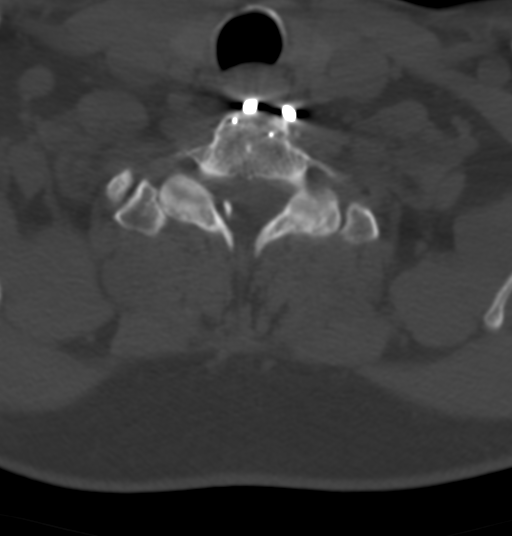
[im 30/90  bone]
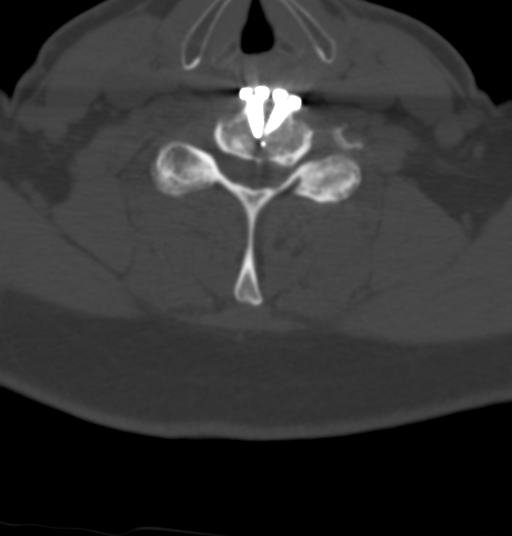
[im 45/90  bone]
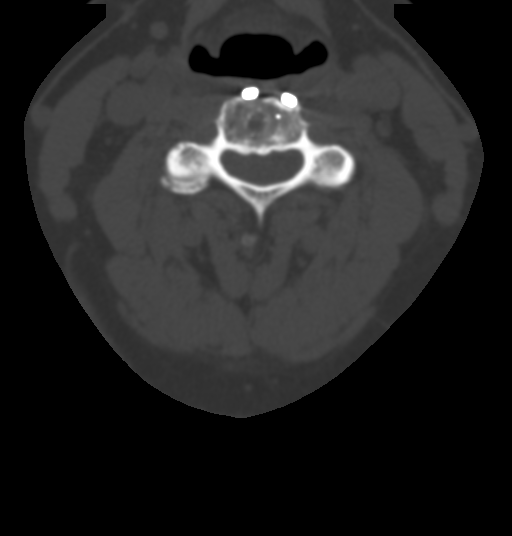
[im 60/90  bone]
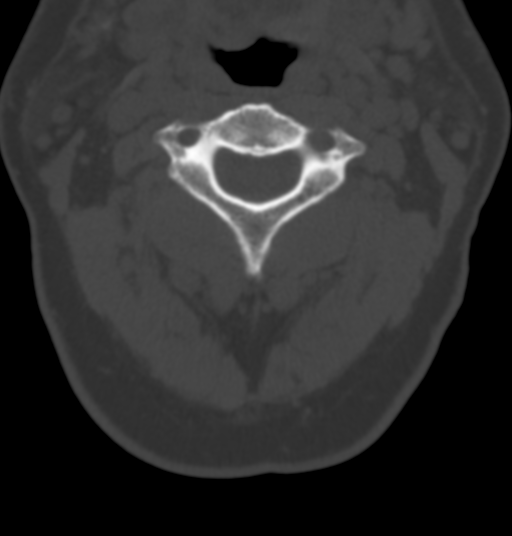
[im 75/90  soft-tissue]
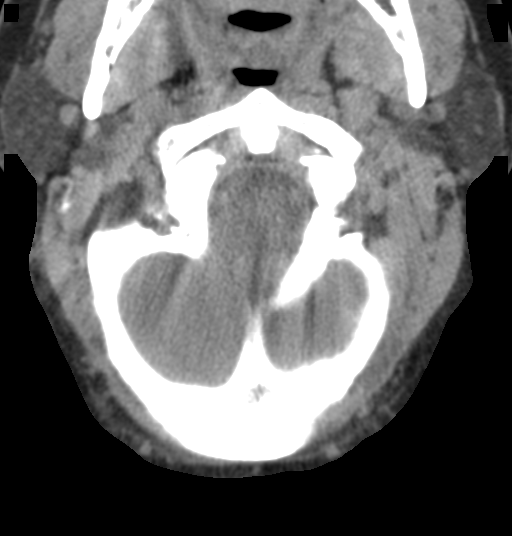
[im 75/90  bone]
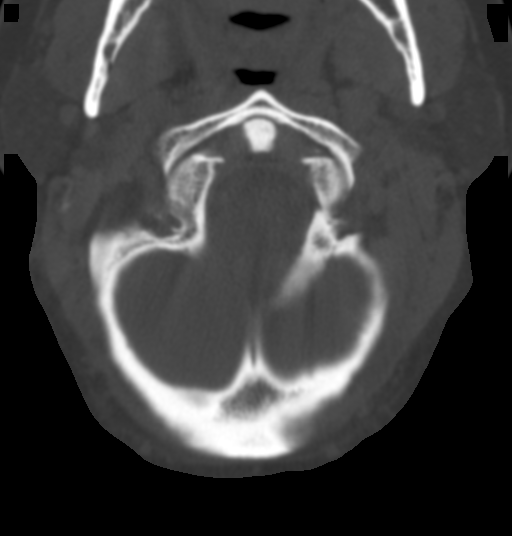

[10 of 14 positions shown; findings below may reference images not displayed]

FINDINGS: Alignment: Normal

Skull base and vertebrae: No fracture or primary bone lesion.
Previous ACDF C3 through C7.

Soft tissues : No soft tissue neck lesion identified.

Disc levels: No abnormality at the foramen magnum. Skull base C1 and
C1-C2 articulations appear normal without degenerative changes.

C2-3: No facet arthropathy. No disc pathology. Mild posterior
ligamentous calcification. No likely compressive canal stenosis.
Foramina widely patent.

C3 through C7: Previous ACDF with anterior plate and screws. Solid
union throughout the region. The canal is somewhat small in general
by AP diameter, measuring as narrow as 6.5 mm at the C5 level, but
this is chronic and fixed. No compressive foraminal stenosis.

C7-T1: Minimal facet osteoarthritis. No canal stenosis. No
significant foraminal narrowing suspected. Detail somewhat limited
by shoulder density.

Upper chest: Negative

Other: None
IMPRESSION: Distant ACDF from C3 through C7 with solid union. No hardware
complication. The spinal canal is somewhat small through that
region, narrowest at the C5-6 level where the AP diameter is only
6.5 mm. However, this is obviously fixed and presumably not grossly
compressive. No foraminal stenosis.

C2-3: No facet or disc pathology seen. Mild posterior ligamentous
calcification. No likely significant stenosis.

C7-T1: Mild facet arthritis and uncovertebral prominence. No likely
compressive stenosis.

## 2021-05-09 ENCOUNTER — Other Ambulatory Visit: Payer: Self-pay

## 2021-05-09 ENCOUNTER — Ambulatory Visit
Admission: RE | Admit: 2021-05-09 | Discharge: 2021-05-09 | Disposition: A | Discharge status: Home / Self Care | Payer: 59 | Source: Ambulatory Visit | Attending: Emergency Medicine | Admitting: Emergency Medicine

## 2021-05-09 VITALS — BP 136/94 | HR 77 | Temp 97.8°F | Resp 18

## 2021-05-09 DIAGNOSIS — Z20822 Contact with and (suspected) exposure to covid-19: Secondary | ICD-10-CM | POA: Insufficient documentation

## 2021-05-09 DIAGNOSIS — R0982 Postnasal drip: Secondary | ICD-10-CM | POA: Diagnosis present

## 2021-05-09 DIAGNOSIS — J029 Acute pharyngitis, unspecified: Secondary | ICD-10-CM | POA: Insufficient documentation

## 2021-05-09 LAB — POCT RAPID STREP A (OFFICE): Rapid Strep A Screen: NEGATIVE

## 2021-05-09 MED ORDER — FLUTICASONE PROPIONATE 50 MCG/ACT NA SUSP: 2.0000 | Freq: Every day | NASAL | 0 refills | Status: DC

## 2021-05-09 NOTE — Discharge Instructions (Addendum)
your rapid strep was negative today, so we have sent off a throat culture.  We will contact you and call in the appropriate antibiotics if your culture comes back positive for an infection requiring antibiotic treatment.  Give [place] a working phone number.  Flonase, saline nasal irrigation with a Lloyd Huger Med rinse and distilled water as often as you want, continue humidifier Robitussin.  Make sure you drink plenty of extra fluids.  Some people find salt water gargles and  Traditional Medicinal's "Throat Coat" tea helpful. Take 5 mL of liquid Benadryl and 5 mL of Maalox. Mix it together, and then hold it in your mouth for as long as you can and then swallow. You may do this 4 times a day.    Go to www.goodrx.com  or www.costplusdrugs.com to look up your medications. This will give you a list of where you can find your prescriptions at the most affordable prices. Or ask the pharmacist what the cash price is, or if they have any other discount programs available to help make your medication more affordable. This can be less expensive than what you would pay with insurance.

## 2021-05-09 NOTE — ED Provider Notes (Signed)
HPI  SUBJECTIVE:  Patient reports sore throat and cough starting 5 days ago.  Sx worse with talking, swallowing.  Sx better with Robitussin DM and a humidifier. has been taking salt water gargles w/ o relief.  States that he worked outside last week, which may have flared his allergies and wonders if this could be allergies. No fevers   No neck stiffness  + Cough productive of greenish sputum.  No wheezing, shortness of breath.  He is able to sleep at night without waking up coughing. + nasal congestion, rhinorrhea, postnasal drip No Myalgias No Headache No Rash  No loss of taste or smell No difficulty breathing No nausea, vomiting No diarrhea No abdominal pain     No Recent Strep, mono, COVID exposure No reflux sxs No Allergy sxs  No Breathing difficulty, sensation of throat swelling shut  + Raspy, but not muffled voice No Drooling No Trismus No abx in past month.  Patient got the COVID booster. + antipyretic in past 4-6 hrs Past medical history of GERD which he states is not bothering him, COVID.  He takes Celebrex for ongoing back and neck issues.    Past Medical History:  Diagnosis Date   S/P cervical spinal fusion     History reviewed. No pertinent surgical history.  History reviewed. No pertinent family history.  Social History   Tobacco Use   Smoking status: Former    Types: Cigarettes    Quit date: 01/16/2009    Years since quitting: 12.3    No current facility-administered medications for this encounter.  Current Outpatient Medications:    fluticasone (FLONASE) 50 MCG/ACT nasal spray, Place 2 sprays into both nostrils daily., Disp: 16 g, Rfl: 0   Diphenhyd-Hydrocort-Nystatin (FIRST-DUKES MOUTHWASH) SUSP, Use as directed 5-10 mLs in the mouth or throat every 2 (two) hours as needed. Use 1:1 ratio with viscous lidocaine, Disp: 237 mL, Rfl: 0   Flaxseed, Linseed, (FLAX SEED OIL) 1000 MG CAPS, Take by mouth., Disp: , Rfl:    Folic Acid 5 MG CAPS, Take by  mouth., Disp: , Rfl:    Magnesium 500 MG CAPS, Take by mouth., Disp: , Rfl:    Multiple Vitamin (THERA) TABS, Take 1 tablet by mouth., Disp: , Rfl:    nabumetone (RELAFEN) 750 MG tablet, Take 750 mg by mouth 2 (two) times daily., Disp: , Rfl:    Omega-3 Fatty Acids (FISH OIL) 1000 MG CAPS, Take 2 g by mouth., Disp: , Rfl:    pregabalin (LYRICA) 150 MG capsule, Take 150 mg by mouth 2 (two) times daily., Disp: , Rfl:    vitamin B-12 (CYANOCOBALAMIN) 100 MCG tablet, Take 50 mcg by mouth., Disp: , Rfl:   No Known Allergies   ROS  As noted in HPI.   Physical Exam  BP (!) 136/94 (BP Location: Left Arm)   Pulse 77   Temp 97.8 F (36.6 C) (Oral)   Resp 18   SpO2 97%   Constitutional: Well developed, well nourished, no acute distress Eyes:  EOMI, conjunctiva normal bilaterally HENT: Normocephalic, atraumatic,mucus membranes moist. + nasal congestion, normal turbinates + erythematous oropharynx + slightly enlarged tonsils no exudates. Uvula midline.  Positive cobblestoning, postnasal drip. Respiratory: Normal inspiratory effort Cardiovascular: Normal rate, no murmurs, rubs, gallops GI: nondistended, nontender. No appreciable splenomegaly skin: No rash, skin intact Lymph: -  Anterior cervical LN.  No posterior cervical lymphadenopathy Musculoskeletal: no deformities Neurologic: Alert & oriented x 3, no focal neuro deficits Psychiatric: Speech and behavior appropriate.  ED Course   Medications - No data to display  Orders Placed This Encounter  Procedures   Novel Coronavirus, NAA ([facility])    Standing Status:   Standing    Number of Occurrences:   1   POCT rapid strep A    Standing Status:   Standing    Number of Occurrences:   1    Results for orders placed or performed during the hospital encounter of 05/09/21 (from the past 24 hour(s))  POCT rapid strep A     Status: None   Collection Time: 05/09/21  3:05 PM  Result Value Ref Range   Rapid Strep A Screen Negative  Negative   No results found.  ED Clinical Impression  1. Acute pharyngitis, unspecified etiology   2. Postnasal drip   3. Encounter for laboratory testing for COVID-19 virus      ED Assessment/Plan  Patient's primary concern is strep or COVID.  We will check a strep.  The meantime, suspect his symptoms are coming from the nasal congestion, postnasal drip.  Home with Flonase, saline nasal irrigation, Benadryl/Maalox mixture, continue humidifier and Robitussin.  Rapid strep negative. Obtaining throat culture to guide antibiotic treatment. Discussed this with patient. We'll contact him if culture is positive, and will call in Appropriate antibiotics.   Patient to followup with PMD when necessary.  COVID pending at the time of signing of this note.  Unfortunately, he is out of the window for antiviral treatment.  Discussed labs,  MDM, plan and followup with patient. Discussed sn/sx that should prompt return to the ED. patient agrees with plan.   Meds ordered this encounter  Medications   fluticasone (FLONASE) 50 MCG/ACT nasal spray    Sig: Place 2 sprays into both nostrils daily.    Dispense:  16 g    Refill:  0     *This clinic note was created using Scientist, clinical (histocompatibility and immunogenetics). Therefore, there may be occasional mistakes despite careful proofreading.     Domenick Gong, MD 05/10/21 [phone]

## 2021-05-09 NOTE — ED Triage Notes (Signed)
Pt sts nasal congestion and scratchy throat x 5 days; pt sts multiple neg covid tests

## 2021-05-10 LAB — NOVEL CORONAVIRUS, NAA: SARS-CoV-2, NAA: NOT DETECTED

## 2021-05-10 LAB — SARS-COV-2, NAA 2 DAY TAT

## 2021-05-12 LAB — CULTURE, GROUP A STREP (THRC)

## 2023-05-27 ENCOUNTER — Ambulatory Visit
Admission: EM | Admit: 2023-05-27 | Discharge: 2023-05-27 | Disposition: A | Discharge status: Home / Self Care | Payer: 59 | Attending: Emergency Medicine | Admitting: Emergency Medicine

## 2023-05-27 DIAGNOSIS — J0100 Acute maxillary sinusitis, unspecified: Secondary | ICD-10-CM | POA: Diagnosis not present

## 2023-05-27 MED ORDER — AMOXICILLIN-POT CLAVULANATE 875-125 MG PO TABS: 1.0000 | ORAL_TABLET | Freq: Two times a day (BID) | ORAL | 0 refills | Status: DC

## 2023-05-27 NOTE — Discharge Instructions (Addendum)
Take antibiotics as prescribed. Continue with Mucinex as well. Nasal saline rinse or spray can also help. Follow-up with PCP or ENT if no improvement after completing antibiotics.

## 2023-05-27 NOTE — ED Triage Notes (Signed)
Pt presents to UC w/ c/o head congestion, some coughing x1 week. Pt has taken Mucinex for symptoms.

## 2023-05-27 NOTE — ED Provider Notes (Signed)
[facility]    CSN: [id number] Arrival date & time: 05/27/23  1157      History   Chief Complaint Chief Complaint  Patient presents with   Nasal Congestion    HPI Brandon Gill is a 52 y.o. male.   Patient presents with concerns of feeling unwell for about a week. He reports headache, nasal congestion, sinus pressure (worse in the left cheek), and post-nasal drip with occasional dry cough particularly at night. He denies fever, sore throat, ear pain, difficulty breathing, or body aches. He has been taking Mucinex with minimal improvement.   The history is provided by the patient.    Past Medical History:  Diagnosis Date   S/P cervical spinal fusion     Patient Active Problem List   Diagnosis Date Noted   Chronic LBP 06/24/2012    History reviewed. No pertinent surgical history.     Home Medications    Prior to Admission medications   Medication Sig Start Date End Date Taking? Authorizing Provider  amoxicillin-clavulanate (AUGMENTIN) 875-125 MG tablet Take 1 tablet by mouth every 12 (twelve) hours. 05/27/23  Yes Zniyah Midkiff L, PA  Diphenhyd-Hydrocort-Nystatin (FIRST-DUKES MOUTHWASH) SUSP Use as directed 5-10 mLs in the mouth or throat every 2 (two) hours as needed. Use 1:1 ratio with viscous lidocaine 01/15/14   Marte, Heather M, PA-C  Flaxseed, Linseed, (FLAX SEED OIL) 1000 MG CAPS Take by mouth.    [provider]  fluticasone (FLONASE) 50 MCG/ACT nasal spray Place 2 sprays into both nostrils daily. 05/09/21   Domenick Gong, MD  Folic Acid 5 MG CAPS Take by mouth.    [provider]  Magnesium 500 MG CAPS Take by mouth.    [provider]  Multiple Vitamin (THERA) TABS Take 1 tablet by mouth.    [provider]  nabumetone (RELAFEN) 750 MG tablet Take 750 mg by mouth 2 (two) times daily.    [provider]  Omega-3 Fatty Acids (FISH OIL) 1000 MG CAPS Take 2 g by mouth.    [provider]   pregabalin (LYRICA) 150 MG capsule Take 150 mg by mouth 2 (two) times daily.    [provider]  vitamin B-12 (CYANOCOBALAMIN) 100 MCG tablet Take 50 mcg by mouth.    [provider]    Family History History reviewed. No pertinent family history.  Social History Social History   Tobacco Use   Smoking status: Former    Current packs/day: 0.00    Types: Cigarettes    Quit date: 01/16/2009    Years since quitting: 14.3     Allergies   Patient has no known allergies.   Review of Systems Review of Systems  Constitutional:  Negative for fatigue and fever.  HENT:  Positive for congestion, postnasal drip and sinus pressure. Negative for ear pain and sore throat.   Respiratory:  Positive for cough. Negative for shortness of breath.   Gastrointestinal:  Negative for nausea and vomiting.  Musculoskeletal:  Negative for myalgias.  Skin:  Negative for rash.  Neurological:  Positive for headaches. Negative for dizziness.     Physical Exam Triage Vital Signs ED Triage Vitals  Encounter Vitals Group     BP 05/27/23 1206 134/82     Systolic BP Percentile --      Diastolic BP Percentile --      Pulse Rate 05/27/23 1206 73     Resp 05/27/23 1206 16     Temp 05/27/23 1206  99.1 F (37.3 C)     Temp Source 05/27/23 1206 Oral     SpO2 05/27/23 1206 95 %     Weight --      Height --      Head Circumference --      Peak Flow --      Pain Score 05/27/23 1210 0     Pain Loc --      Pain Education --      Exclude from Growth Chart --    No data found.  Updated Vital Signs BP 134/82 (BP Location: Left Arm)   Pulse 73   Temp 99.1 F (37.3 C) (Oral)   Resp 16   SpO2 95%   Visual Acuity Right Eye Distance:   Left Eye Distance:   Bilateral Distance:    Right Eye Near:   Left Eye Near:    Bilateral Near:     Physical Exam Vitals and nursing note reviewed.  Constitutional:      General: He is not in acute distress. HENT:     Head: Normocephalic.      Nose: Congestion present. No rhinorrhea.     Left Sinus: Maxillary sinus tenderness present.     Mouth/Throat:     Mouth: Mucous membranes are moist.     Pharynx: Oropharynx is clear.  Eyes:     Conjunctiva/sclera: Conjunctivae normal.     Pupils: Pupils are equal, round, and reactive to light.  Cardiovascular:     Rate and Rhythm: Normal rate and regular rhythm.     Heart sounds: Normal heart sounds.  Pulmonary:     Effort: Pulmonary effort is normal.     Breath sounds: Normal breath sounds. No wheezing, rhonchi or rales.  Musculoskeletal:     Cervical back: Normal range of motion.  Lymphadenopathy:     Cervical: No cervical adenopathy.  Skin:    Findings: No rash.  Neurological:     Mental Status: He is alert.  Psychiatric:        Mood and Affect: Mood normal.      UC Treatments / Results  Labs (all labs ordered are listed, but only abnormal results are displayed) Labs Reviewed - No data to display  EKG   Radiology No results found.  Procedures Procedures (including critical care time)  Medications Ordered in UC Medications - No data to display  Initial Impression / Assessment and Plan / UC Course  I have reviewed the triage vital signs and the nursing notes.  Pertinent labs & imaging results that were available during my care of the patient were reviewed by me and considered in my medical decision making (see chart for details).     S/s consistent with sinusitis - empiric abx given due to duration over one week. Discussed continued sx tx and follow-up precautions.   E/M: 1 acute uncomplicated illness, no data, moderate risk due to prescription management  Final Clinical Impressions(s) / UC Diagnoses   Final diagnoses:  Acute non-recurrent maxillary sinusitis     Discharge Instructions      Take antibiotics as prescribed. Continue with Mucinex as well. Nasal saline rinse or spray can also help. Follow-up with PCP or ENT if no improvement after  completing antibiotics.      ED Prescriptions     Medication Sig Dispense Auth. Provider   amoxicillin-clavulanate (AUGMENTIN) 875-125 MG tablet Take 1 tablet by mouth every 12 (twelve) hours. 14 tablet Estanislado Pandy, [place]      PDMP  not reviewed this encounter.   Estanislado Pandy, [place] 05/27/23 1228

## 2023-11-21 ENCOUNTER — Other Ambulatory Visit: Payer: Self-pay | Admitting: Nurse Practitioner

## 2023-11-21 ENCOUNTER — Ambulatory Visit
Admission: RE | Admit: 2023-11-21 | Discharge: 2023-11-21 | Disposition: A | Discharge status: Home / Self Care | Source: Ambulatory Visit | Attending: Nurse Practitioner | Admitting: Nurse Practitioner

## 2023-11-21 DIAGNOSIS — M79644 Pain in right finger(s): Secondary | ICD-10-CM

## 2024-08-13 ENCOUNTER — Encounter: Payer: Self-pay | Admitting: *Deleted

## 2024-08-13 ENCOUNTER — Ambulatory Visit: Admission: EM | Admit: 2024-08-13 | Discharge: 2024-08-13 | Disposition: A | Discharge status: Home / Self Care

## 2024-08-13 DIAGNOSIS — J069 Acute upper respiratory infection, unspecified: Secondary | ICD-10-CM | POA: Diagnosis not present

## 2024-08-13 HISTORY — DX: Other chronic pain: G89.29

## 2024-08-13 LAB — POC COVID19/FLU A&B COMBO
Covid Antigen, POC: NEGATIVE
Influenza A Antigen, POC: NEGATIVE
Influenza B Antigen, POC: NEGATIVE

## 2024-08-13 MED ORDER — FLUTICASONE PROPIONATE 50 MCG/ACT NA SUSP: 2.0000 | Freq: Every day | NASAL | 0 refills | Status: AC

## 2024-08-13 MED ORDER — PSEUDOEPHEDRINE HCL 30 MG PO TABS: 30.0000 mg | ORAL_TABLET | ORAL | 0 refills | Status: AC | PRN

## 2024-08-13 MED ORDER — BENZONATATE 100 MG PO CAPS: 100.0000 mg | ORAL_CAPSULE | Freq: Three times a day (TID) | ORAL | 0 refills | Status: AC

## 2024-08-13 NOTE — ED Triage Notes (Signed)
 Brandon Gill states he started losing his voice. Monday had cough and congestion. Tried taking sudafed without any relief. Temp last night was 100. He took tylenol  this morning. Having some soreness in his neck also

## 2024-08-13 NOTE — ED Provider Notes (Signed)
 [facility]    CSN: [id number] Arrival date & time: 08/13/24  0844      History   Chief Complaint Chief Complaint  Patient presents with   Fever    HPI Brandon Gill is a 53 y.o. male.   Patient presents today due to feeling poorly since Sunday.  Patient states that he is out of fever (highest temp 100.5), body aches, cough productive of clear sputum, nasal congestion, nasal drainage, and fatigue.  Patient states that he has not been around sick people around Thanksgiving.  Patient states that he has taken multiple over-the-counter cold medications without significant relief.  The history is provided by the patient.  Fever   Past Medical History:  Diagnosis Date   Chronic back pain    S/P cervical spinal fusion     Patient Active Problem List   Diagnosis Date Noted   Chronic low back pain 06/24/2012    Past Surgical History:  Procedure Laterality Date   BACK SURGERY         Home Medications    Prior to Admission medications   Medication Sig Start Date End Date Taking? Authorizing Provider  albuterol (VENTOLIN HFA) 108 (90 Base) MCG/ACT inhaler Inhale 2 puffs into the lungs every 6 (six) hours as needed.   Yes [provider]  benzonatate (TESSALON) 100 MG capsule Take 1 capsule (100 mg total) by mouth every 8 (eight) hours. 08/13/24  Yes Andra Krabbe C, PA-C  celecoxib (CELEBREX) 200 MG capsule Take 200 mg by mouth daily.   Yes [provider]  Cholecalciferol (KP VITAMIN D) 25 MCG (1000 UT) capsule Take 1,000 Units by mouth.   Yes [provider]  DULoxetine (CYMBALTA) 60 MG capsule Take 60 mg by mouth daily.   Yes [provider]  HYDROcodone -acetaminophen  (NORCO/VICODIN) 5-325 MG tablet Take 1 tablet by mouth every 8 (eight) hours as needed.   Yes [provider]  Magnesium 300 MG CAPS 1 capsule with a meal Orally Once a day   Yes [provider]  Magnesium 500 MG CAPS Take by  mouth.   Yes [provider]  Multiple Vitamins-Minerals (CENTRUM MEN) TABS Take by mouth. 09/16/11  Yes [provider]  Omega-3 Fatty Acids (FISH OIL) 1000 MG CAPS Take 2 g by mouth.   Yes [provider]  pregabalin (LYRICA) 150 MG capsule Take 150 mg by mouth 2 (two) times daily.   Yes [provider]  pseudoephedrine (SUDAFED) 30 MG tablet Take 1 tablet (30 mg total) by mouth every 4 (four) hours as needed for congestion. 08/13/24  Yes Andra Krabbe C, PA-C  rosuvastatin (CRESTOR) 10 MG tablet Take 10 mg by mouth. 02/07/21  Yes [provider]  topiramate (TOPAMAX) 50 MG tablet TAKE 1 TABLET(50 MG) BY MOUTH TWICE DAILY 12/27/20  Yes [provider]  vitamin B-12 (CYANOCOBALAMIN) 100 MCG tablet Take 50 mcg by mouth.   Yes [provider]  Vitamin E 400 units TABS Take 400 Units by mouth. 09/10/21  Yes [provider]  Diphenhyd-Hydrocort-Nystatin (FIRST-DUKES MOUTHWASH) SUSP Use as directed 5-10 mLs in the mouth or throat every 2 (two) hours as needed. Use 1:1 ratio with viscous lidocaine 01/15/14   Marte, Heather M, PA-C  Flaxseed, Linseed, (FLAX SEED OIL) 1000 MG CAPS Take by mouth.    [provider]  fluticasone  (FLONASE ) 50 MCG/ACT nasal spray Place 2 sprays into both nostrils daily. 08/13/24   Andra Krabbe BROCKS, PA-C  Folic  Acid 5 MG CAPS Take by mouth.    [provider]  Multiple Vitamin (THERA) TABS Take 1 tablet by mouth.    [provider]  nabumetone (RELAFEN) 750 MG tablet Take 750 mg by mouth 2 (two) times daily.    [provider]    Family History History reviewed. No pertinent family history.  Social History Social History   Tobacco Use   Smoking status: Former    Current packs/day: 0.00    Types: Cigarettes    Quit date: 01/16/2009    Years since quitting: 15.5   Smokeless tobacco: Never  Vaping Use   Vaping status: Never Used  Substance Use Topics   Alcohol  use: Not Currently    Comment: rare   Drug use: Never     Allergies   Patient has no known allergies.   Review of Systems Review of Systems  Constitutional:  Positive for fever.     Physical Exam Triage Vital Signs ED Triage Vitals  Encounter Vitals Group     BP 08/13/24 0915 126/84     Girls Systolic BP Percentile --      Girls Diastolic BP Percentile --      Boys Systolic BP Percentile --      Boys Diastolic BP Percentile --      Pulse Rate 08/13/24 0915 65     Resp 08/13/24 0915 18     Temp 08/13/24 0915 98.9 F (37.2 C)     Temp Source 08/13/24 0915 Oral     SpO2 08/13/24 0915 97 %     Weight --      Height --      Head Circumference --      Peak Flow --      Pain Score 08/13/24 0911 4     Pain Loc --      Pain Education --      Exclude from Growth Chart --    No data found.  Updated Vital Signs BP 126/84 (BP Location: Right Arm)   Pulse 65   Temp 98.9 F (37.2 C) (Oral)   Resp 18   SpO2 97%   Visual Acuity Right Eye Distance:   Left Eye Distance:   Bilateral Distance:    Right Eye Near:   Left Eye Near:    Bilateral Near:     Physical Exam Vitals and nursing note reviewed.  Constitutional:      General: He is not in acute distress.    Appearance: Normal appearance. He is not ill-appearing, toxic-appearing or diaphoretic.  HENT:     Nose: Congestion (moderately enlarged turbinates) present. No rhinorrhea.     Mouth/Throat:     Mouth: Mucous membranes are moist.     Pharynx: Oropharynx is clear. No oropharyngeal exudate or posterior oropharyngeal erythema.  Eyes:     General: No scleral icterus. Cardiovascular:     Rate and Rhythm: Normal rate and regular rhythm.     Heart sounds: Normal heart sounds.  Pulmonary:     Effort: Pulmonary effort is normal. No respiratory distress.     Breath sounds: Normal breath sounds. No wheezing or rhonchi.  Skin:    General: Skin is warm.  Neurological:     Mental Status: He is alert and oriented to  person, place, and time.  Psychiatric:        Mood and Affect: Mood normal.        Behavior: Behavior normal.      UC  Treatments / Results  Labs (all labs ordered are listed, but only abnormal results are displayed) Labs Reviewed  POC COVID19/FLU A&B COMBO - Normal    EKG   Radiology No results found.  Procedures Procedures (including critical care time)  Medications Ordered in UC Medications - No data to display  Initial Impression / Assessment and Plan / UC Course  I have reviewed the triage vital signs and the nursing notes.  Pertinent labs & imaging results that were available during my care of the patient were reviewed by me and considered in my medical decision making (see chart for details).    Final Clinical Impressions(s) / UC Diagnoses   Final diagnoses:  Viral URI     Discharge Instructions      Neg for covid and flu.  You been diagnosed with a viral illness today. -Viruses have to run their course and medicines that are prescribed are meant to help with symptoms. - With viruses usually feel poorly from 3 to 7 days with cough being the last symptoms to resolve.  -Cough can linger from days to weeks.  Antibiotics are not effective for viruses. -If your cough lasts more than 2 weeks and you are coughing so hard that you are vomiting or feel like you could pass out we need to follow-up with PCP for further testing and evaluation. -Rest, increase water intake, may use pseudoephedrine for nasal congestion, Delsym (dextromethorphan) or honey as needed for cough, and ibuprofen and/or Tylenol  as directed on packaging for pain and fever. -If you have hypertension you should take Coricidin or other OTC meds approved for people with high blood pressure. -You may use a spoonful of honey every 4-6 hours as needed for throat pain and cough. -Warm tea with honey and lemon are helpful for soothe throat as well.  Chloraseptic and Cepacol make a throat lozenge with  numbing medication, can be purchased over-the-counter. -May also use Flonase  or sinus rinse for sinus pressure or nasal congestion.  Be sure to use distilled bottled water for sinus rinses. -May use coolmist humidifier to open up nasal passages -May elevate head to assist with postnasal drainage. -If you feel poorly (fever, fatigue, shortness of breath, nausea, etc.) for more than 10 days to be sure to follow-up with PCP or in clinic for further evaluation and additional treatments. If you experience chest pain with shortness of breath or pulse oxygen less than 95% you should report to the ER.     ED Prescriptions     Medication Sig Dispense Auth. Provider   fluticasone  (FLONASE ) 50 MCG/ACT nasal spray Place 2 sprays into both nostrils daily. 16 g Hipolito Martinezlopez C, PA-C   pseudoephedrine (SUDAFED) 30 MG tablet Take 1 tablet (30 mg total) by mouth every 4 (four) hours as needed for congestion. 30 tablet Andra Krabbe C, PA-C   benzonatate (TESSALON) 100 MG capsule Take 1 capsule (100 mg total) by mouth every 8 (eight) hours. 30 capsule Andra Krabbe BROCKS, PA-C      PDMP not reviewed this encounter.   Andra Krabbe BROCKS, PA-C 08/13/24 [phone]

## 2024-08-13 NOTE — Discharge Instructions (Signed)
 Neg for covid and flu.  You been diagnosed with a viral illness today. -Viruses have to run their course and medicines that are prescribed are meant to help with symptoms. - With viruses usually feel poorly from 3 to 7 days with cough being the last symptoms to resolve.  -Cough can linger from days to weeks.  Antibiotics are not effective for viruses. -If your cough lasts more than 2 weeks and you are coughing so hard that you are vomiting or feel like you could pass out we need to follow-up with PCP for further testing and evaluation. -Rest, increase water intake, may use pseudoephedrine for nasal congestion, Delsym (dextromethorphan) or honey as needed for cough, and ibuprofen  and/or Tylenol  as directed on packaging for pain and fever. -If you have hypertension you should take Coricidin or other OTC meds approved for people with high blood pressure. -You may use a spoonful of honey every 4-6 hours as needed for throat pain and cough. -Warm tea with honey and lemon are helpful for soothe throat as well.  Chloraseptic and Cepacol make a throat lozenge with numbing medication, can be purchased over-the-counter. -May also use Flonase  or sinus rinse for sinus pressure or nasal congestion.  Be sure to use distilled bottled water for sinus rinses. -May use coolmist humidifier to open up nasal passages -May elevate head to assist with postnasal drainage. -If you feel poorly (fever, fatigue, shortness of breath, nausea, etc.) for more than 10 days to be sure to follow-up with PCP or in clinic for further evaluation and additional treatments. If you experience chest pain with shortness of breath or pulse oxygen less than 95% you should report to the ER.
